# Patient Record
Sex: Female | Born: 1975 | Race: White | Hispanic: No | Marital: Married
Health system: Midwestern US, Academic
[De-identification: ages and names within clinical notes are randomized; demographics above are authoritative.]

---

## 2016-08-28 MED ORDER — SODIUM CHLORIDE 3 % IN NEBU
4 mL | Freq: Two times a day (BID) | RESPIRATORY_TRACT | 11 refills | 30.00000 days | Status: DC | PRN
Start: 2016-08-28 — End: 2017-09-18

## 2016-08-28 MED ORDER — FLUTICASONE-SALMETEROL 500-50 MCG/DOSE IN DSDV
1 | Freq: Two times a day (BID) | RESPIRATORY_TRACT | 11 refills | Status: AC
Start: 2016-08-28 — End: ?

## 2016-08-28 MED ORDER — SULFAMETHOXAZOLE-TRIMETHOPRIM 800-160 MG PO TAB
1 | ORAL_TABLET | ORAL | 3 refills | Status: DC
Start: 2016-08-28 — End: 2017-01-08

## 2016-10-17 ENCOUNTER — Encounter: Admit: 2016-10-17 | Discharge: 2016-10-17 | Payer: 59

## 2016-11-21 ENCOUNTER — Encounter: Admit: 2016-11-21 | Discharge: 2016-11-21 | Payer: 59

## 2016-11-21 NOTE — Telephone Encounter
Re-faxed request on 11/21/16 as medical records was asking who is requesting records.     Medical Records informed that we are pt's pulmonologist and needing all cultures for treatment plan they have on file.

## 2016-11-23 NOTE — Telephone Encounter
See Telephone Note dated 08/30/16 regarding sputum results from PCP.  These were received and sent to Dr. Maisie Fushomas for review.  Shearon StallsAmanda Ortiz-Cadena, RN

## 2016-11-23 NOTE — Telephone Encounter
Faxed records request to [facility] on 11/21/16 for the following: any and all sputim cultures and sensitivities.    Called Medical records at [facility] on 11/23/16 to follow-up on records request. Fax should be received by 11/27/16.

## 2016-12-01 ENCOUNTER — Encounter: Admit: 2016-12-01 | Discharge: 2016-12-01 | Payer: 59

## 2016-12-01 NOTE — Progress Notes
Review of outside records:  Sputum culture 07/06/2016: Heavy growth of Haemophilus influenza, beta-lactamase negative

## 2016-12-01 NOTE — Progress Notes
Review of outside records:  03/09/2016: Heavy growth of Haemophilus influenza, beta lactamase negative

## 2017-01-07 ENCOUNTER — Encounter: Admit: 2017-01-07 | Discharge: 2017-01-07 | Payer: 59

## 2017-01-08 MED ORDER — SULFAMETHOXAZOLE-TRIMETHOPRIM 800-160 MG PO TAB
ORAL_TABLET | 3 refills | Status: AC
Start: 2017-01-08 — End: 2017-09-18

## 2017-01-08 NOTE — Telephone Encounter
Pharmacy is requesting a refill of Bactrim DS. Unable to refill per Allergy/Immunology standing orders protocol as this medication is not part of the protocol.  Routing to Dr. Barbette OrGierer for refill if appropriate.

## 2017-02-06 ENCOUNTER — Ambulatory Visit: Admit: 2017-02-06 | Discharge: 2017-02-06 | Payer: 59

## 2017-02-06 ENCOUNTER — Encounter: Admit: 2017-02-06 | Discharge: 2017-02-06 | Payer: 59

## 2017-02-06 DIAGNOSIS — J45909 Unspecified asthma, uncomplicated: Principal | ICD-10-CM

## 2017-02-06 DIAGNOSIS — F909 Attention-deficit hyperactivity disorder, unspecified type: ICD-10-CM

## 2017-02-06 DIAGNOSIS — B999 Unspecified infectious disease: Principal | ICD-10-CM

## 2017-02-06 DIAGNOSIS — T781XXD Other adverse food reactions, not elsewhere classified, subsequent encounter: ICD-10-CM

## 2017-02-06 DIAGNOSIS — J309 Allergic rhinitis, unspecified: ICD-10-CM

## 2017-02-06 DIAGNOSIS — G43909 Migraine, unspecified, not intractable, without status migrainosus: ICD-10-CM

## 2017-02-06 DIAGNOSIS — Z92241 Personal history of systemic steroid therapy: ICD-10-CM

## 2017-02-06 DIAGNOSIS — R05 Cough: ICD-10-CM

## 2017-02-06 DIAGNOSIS — K449 Diaphragmatic hernia without obstruction or gangrene: ICD-10-CM

## 2017-02-06 DIAGNOSIS — J479 Bronchiectasis, uncomplicated: ICD-10-CM

## 2017-02-06 DIAGNOSIS — F329 Major depressive disorder, single episode, unspecified: ICD-10-CM

## 2017-02-06 DIAGNOSIS — J4550 Severe persistent asthma, uncomplicated: ICD-10-CM

## 2017-02-06 DIAGNOSIS — E282 Polycystic ovarian syndrome: ICD-10-CM

## 2017-02-06 DIAGNOSIS — J189 Pneumonia, unspecified organism: ICD-10-CM

## 2017-02-06 DIAGNOSIS — K219 Gastro-esophageal reflux disease without esophagitis: ICD-10-CM

## 2017-02-06 DIAGNOSIS — E039 Hypothyroidism, unspecified: ICD-10-CM

## 2017-02-06 MED ORDER — PREDNISONE 10 MG PO TAB
ORAL_TABLET | Freq: Every day | 0 refills | Status: AC
Start: 2017-02-06 — End: 2017-06-21

## 2017-02-06 NOTE — Progress Notes
Date of Service: 02/06/2017    Subjective:             Angela Berger is a 41 y.o. female with allergic rhinoconjunctivitis, food allergies, bronchiectasis, steroid therapy, chronic cough, recurrent infections, severe persistent asthma, and seasonal allergies who is here today for follow up.      History of Present Illness    She states she spent more time on steroids in the last month than not.  This was for both sinus and chest symptoms. She tried with prednisone 60 mg x 5 days and then 48 hours in she got fever and then started Augmentin.  She stopped her ppx Bactrim while on this. She was very short of breath with minimal exertions. She has finished her prednisone about a week ago. She had been using up to 4 puffs of albuterol every 2 hours and nebs. She does not think that the Bactrim has helped but her husband does not think she coughs as much. She thinks her flares might have been at longer intervals. She has not had bronchoscopy.  She would be amenable to going to [employer].      She went to a hotel last week off prednisone that pops popcorn daily at the [employer] meeting. She was using albuterol every 2 hours. She changed to Brovana and Pulmicort for several days and is now back to using them at night. She is taking Advair, Pulmicort, and Spiriva in the morning. She thinks the Advair makes her cough. Advair has been the best inhaler by far.  She has done better with the steroids.     She is doing her allergy injections only twice per month but none since being on steroids.    She walked into the room yesterday where there were corn chips and had itching and throat tightness. She has done SL drops for the corn allergy and she does not know anyone that can do this.      She has not gotten her flu shot.    Rhinitis Control Assessment Test    1. During the past week, how often did you have nasal congestion?  1) Extremely often  2) Often  3) Sometimes  4) Rarely  5) Never 2. During the past week, how often did you sneeze?  1) Extremely often  2) Often  3) Sometimes  4) Rarely  5) Never    3. During the past week, how often did you have watery eyes?  1) Extremely often  2) Often  3) Sometimes  4) Rarely  5) Never    4. During the past week, to what extent did your nasal or other allergy symptoms interfere with your sleep?  1) All the time  2) A lot  3) Somewhat  4) A little  5) Not at all    5. During the past week, how often did you avoid any activities (for example visiting a house with a dog or cat, gardening) because of your nasal or other allergy symptoms?  1) Extremely often  2) Often  3) Sometimes  4) Rarely  5) Never    6. During the past week, how well were your nasal or other allergy symptoms controlled?  1) Not at all  2) A little  3) Somewhat  4) Very  5) Completely    Total Score: 12  A total score of 21 or less may indicate inadequate control of symptoms.     Asthma Control Test for people  12 years and older    1. In the past 4 weeks, how much of the time did your asthma keep you from getting as much done at work, school or at home?    1-  All of the time  2-  Most of the time  3-  Some of the time  4-  A little of the time  5-  None of the time    2. During the past 4 weeks, how often have you had shortness of breath?    1-  More than once a day  2-  Once a day  3-  3-6 times a week  4-  Once or twice a week  5-  Not at all    3. During the past 4 weeks, how often did your asthma symptoms (wheezing, coughing, shortness of breath, chest tightness or pain) wake you up at night or earlier than usual in the morning?    1-  4 or more night a week  2-  2-3 nights a week  3-  Once a week  4-  Once or twice  5-  Not at all    4. During the past 4 weeks, how often have you used your rescue inhaler or nebulizer medication (such as albuterol)?    1-  3 or more times per day  2-  1 or 2 times per day  3-  2 or 3 times per week  4-  Once a week or less  5-  Not at all 5. How would you rate your asthma control during the past 4 weeks?    1- Not controlled at all  2- Poorly controlled  3- Somewhat controlled  4- Well controlled  5- Completely controlled      ACT SCORE = 6  (A score of 19 or less might suggest that the patient's asthma is not as well controlled as it could be)         Review of Systems   Constitutional: Positive for activity change, appetite change and fatigue.   HENT: Positive for congestion, facial swelling, postnasal drip, rhinorrhea, sinus pressure, sore throat and tinnitus.    Eyes: Positive for itching.   Respiratory: Positive for cough, chest tightness, shortness of breath, wheezing and stridor.    Gastrointestinal: Positive for diarrhea and nausea.   Allergic/Immunologic: Positive for environmental allergies and food allergies.   Neurological: Positive for headaches.   All other systems reviewed and are negative.        Objective:         ??? albuterol (VENTOLIN HFA, PROAIR HFA) 90 mcg/actuation inhaler Inhale 2 Puffs by mouth every 6 hours as needed for Wheezing.   ??? albuterol-ipratropium (DUO-NEB, DUO-VENT) 0.5 mg-3 mg(2.5 mg base)/3 mL nebulizer solution Inhale 3 mL solution by nebulizer as directed every 6 hours as needed for Wheezing.   ??? arformoterol (BROVANA) 15 mcg/2 mL nebulizer solution Inhale 2 mL solution by nebulizer as directed twice daily.   ??? budesonide respule(+) (PULMICORT) 1 mg/2 mL nbsp nebulizer solution Inhale 2 mL solution by nebulizer as directed twice daily.   ??? budesonide(+) (PULMICORT FLEXHALER) 180 mcg/inh inhaler Inhale 2 puffs by mouth into the lungs twice daily.   ??? Cholecalciferol (Vitamin D3) 2,000 unit cap Take 1 Cap by mouth daily.   ??? DIPHENHYDRAMINE HCL (BENADRYL ALLERGY PO) Take  by mouth as Needed.   ??? EPINEPHrine(+) (EPIPEN 2-PAK) 1 mg/mL injection pen (2-Pack) Inject 0.3 mg into the muscle once  as needed. Inject 0.3 mg (1 Pen) into thigh if needed for anaphylactic reaction. May repeat in 5-15 minutes if needed. ??? fluticasone/salmeterol (ADVAIR DISKUS) 500-50 mcg inhalation disk Inhale 1 puff by mouth into the lungs every 12 hours.   ??? ibuprofen (MOTRIN) 800 mg tablet Take 800 mg by mouth three times daily.   ??? Levocetirizine (XYZAL) 5 mg tab Take 10 mg by mouth twice daily.   ??? levothyroxine (SYNTHROID) 100 mcg tablet Take 100 mcg by mouth daily.   ??? Magnesium Oxide 500 mg cap Take 500 mg by mouth daily.   ??? metFORMIN-XR(+) (GLUCOPHAGE XR) 500 mg tablet Take 1,000 mg by mouth daily with dinner.   ??? methylphenidate CR (CONCERTA) 54 mg tablet Take 54 mg by mouth every morning   ??? mometasone (NASONEX) 50 mcg/actuation nasal spray Apply 2 Sprays to each nostril as directed daily.   ??? montelukast (SINGULAIR) 10 mg tablet Take 10 mg by mouth at bedtime daily.   ??? ondansetron (ZOFRAN) 8 mg tablet Take 8 mg by mouth every 8 hours as needed for Nausea.   ??? pantoprazole DR (PROTONIX) 40 mg tablet Take 40 mg by mouth daily.   ??? prednisone (DELTASONE) 10 mg tablet 60 mg daily for 5 days, taper by 10 mg every 3 days, then stay on 10 mg   ??? ranitidine(+) (ZANTAC) 150 mg tablet Take 150 mg by mouth twice daily.   ??? sertraline (ZOLOFT) 50 mg tablet Take 50 mg by mouth daily. Indications: only take 1/2 a tablet   ??? sodium chloride 3 % nebulizer solution Inhale 4 mL by mouth into the lungs twice daily as needed.   ??? tiotropium bromide (SPIRIVA RESPIMAT) 2.5 mcg/actuation inhaler Inhale 2 Puffs by mouth into the lungs daily.   ??? trimethoprim/sulfamethoxazole (BACTRIM DS) 160/800 mg tablet TAKE ONE (1) TABLET BY MOUTH 3 TIMES WEEKLY     Vitals:    02/06/17 1621   BP: (!) 162/92   Pulse: 102   Resp: 16   Temp: 36.4 ???C (97.6 ???F)   TempSrc: Oral   SpO2: 97%   Weight: 108.2 kg (238 lb 9.6 oz)   Height: 170.2 cm (67)     Body mass index is 37.37 kg/m???.     Physical Exam   Constitutional: She is oriented to person, place, and time. Vital signs are normal. She appears well-developed and well-nourished.   HENT: Head: Normocephalic and atraumatic.   Right Ear: Tympanic membrane, external ear and ear canal normal.   Left Ear: Tympanic membrane, external ear and ear canal normal.   Nose: Nose normal.   Mouth/Throat: Oropharynx is clear and moist and mucous membranes are normal. No oropharyngeal exudate.   Eyes: Conjunctivae are normal. Right eye exhibits no discharge. Left eye exhibits no discharge. No scleral icterus.   Cardiovascular: Normal rate, regular rhythm and normal heart sounds.  Exam reveals no gallop and no friction rub.    No murmur heard.  Pulmonary/Chest: Effort normal. No respiratory distress. She has wheezes (end expiratory bilateral bases). She has rhonchi. She has no rales. She exhibits no tenderness.   Neurological: She is alert and oriented to person, place, and time.   Skin: Skin is warm and dry. No rash noted. No erythema.   Psychiatric: She has a normal mood and affect.   Vitals reviewed.           Assessment and Plan:    Problem   Bronchiectasis (Hcc)    Minimal lingular, likely due to  recurrent infections  05/28/16 - Sweat chloride <10  Patient reported previous alpha 1 antitrypsin level normal and immune evaluation to date has been normal.    - Continue management as noted below     Recurrent Infections    Pneumonia possibly 5 times: culture + for Pseudomonas, Strep pneumo, and H influenza.  History of fundoplication and large hiatal hernia along with allergies.    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  Corn - anaphylaxis repeatedly, even with D5 IVFs and being around popcorn.  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    04/20/16 - IgE was negative to corn and wheat.    Patient is unable to be off antihistamines for food challenges.    - Continue avoidance due to reproducible clinical symptoms.     Productive Cough    09/2015 CT chest with RLL infiltrates, lingular bronchiectasis  02/2016 Sputum culture: H. Influenzae, AFB reportedly negative  05/2016 Sputum culture: H. influenzae    - As noted below     Uncomplicated Severe Persistent Asthma    PFT  Normal PFTs (08/2015)  Normal spirometry (04/2016)  Inhaler Regimen    ** Patient unable to tolerate HFA products due to corn allergy   **She feels this only lasts 22 hours  Advair 500/50 twice a day  Pulmicort 180 2 puffs BID  Spiriva 2.5 2 puffs daily  Albuterol (ProAir Respiclick) every 4 hours as needed  Albuterol/Ipratropium, Arformoterol, Pulmicort nebs to replace above inhalers when unable to get good deposition of inhalers  Comorbidity  GERD -- Protonix daily, well controlled with current diet, hiatal hernia present  Seasonal allergies -- Singulair, Flonase, Levocetirizine, immunotherapy with Dr. 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  Sleep studies in past negative for OSA  Vaccinations  Influenza - Fall 2017   Pneumococcal - 2011  Prevnar - 2014  Hib - 08/28/16 since sputum repeatedly positive for H influenzae  Exacerbations  Last 06/2016, multiple exacerbations since January  Sputum 02/2016 and 05/2016 with H. influenzae  Imaging  10/13/15 - CT chest revealed ground glass infiltrates and lingular bronchiectasis.  Complications Xolair caused palpitations repeatedly with dosing and a holter at that time showed PVCs.  Labs  04/20/16 - IgE 38, immune evaluation fairly normal, 0 eosinophils, essentially negative hypersensitivity panel with only slight elevated to penicillium, but no significant exposures  05/28/16 - sweat chloride was negative (<10)    - Continue current management.  - Will give another prednisone 60 mg taper  - Will do a trial of Fasenra.     Allergic Rhinoconjunctivitis    Currently on levocetirizine, Singulair, and Nasonex as well.  She is still getting weekly shots with Dr. Excell Seltzer and has been on them since 2008.  She thinks they have helped.  Shot 1: DF, DP, cat, Alternaria, Helminothosporium, Epicoccum, ragweed, [place], aspergillus, maple, Cladosporium, dog, June, Fusarium, and Mucor  Shot 2: planbtain, marshelder, sycamore, lambsquarter, goldenrod, pigweed, mountain cedar, elm, walnut, [place], [place], [place], R thistle, [place], and [place].  She has large local reactions but no systemic reactions.    04/20/16 - IgE aeroallergen panel was positive only to ash, cat, short/Western ragweed.    - Continue current management.      H/O Corticosteroid Therapy    Recurrent corticosteroid exposure, family history of osteoporosis.  Per patient, DEXA summer 2017 was within normal limits.    - Continue monitoring and vitamin D therapy.        RTC in 4 weeks.    Thank you for allowing [place] to participate in the care of this patient.  Please feel free to contact us if there are any questions or concerns about the patient.  ???  Hyman Bower, DO  Associate Professor  Division of Allergy, Immunology, and Rheumatology  Department of Medicine and Department of Pediatrics  [facility]

## 2017-02-07 LAB — COMPREHENSIVE METABOLIC PANEL

## 2017-02-07 LAB — CBC AND DIFF

## 2017-02-07 LAB — SED RATE

## 2017-02-07 LAB — C REACTIVE PROTEIN (CRP)

## 2017-02-12 ENCOUNTER — Encounter: Admit: 2017-02-12 | Discharge: 2017-02-12 | Payer: 59

## 2017-02-12 NOTE — Telephone Encounter
Access 360 Enrollment Form for Fasenra faxed to [employer] at [phone].

## 2017-02-17 ENCOUNTER — Encounter: Admit: 2017-02-17 | Discharge: 2017-02-17 | Payer: 59

## 2017-02-17 DIAGNOSIS — F909 Attention-deficit hyperactivity disorder, unspecified type: ICD-10-CM

## 2017-02-17 DIAGNOSIS — G43909 Migraine, unspecified, not intractable, without status migrainosus: ICD-10-CM

## 2017-02-17 DIAGNOSIS — J189 Pneumonia, unspecified organism: ICD-10-CM

## 2017-02-17 DIAGNOSIS — J45909 Unspecified asthma, uncomplicated: Principal | ICD-10-CM

## 2017-02-17 DIAGNOSIS — F329 Major depressive disorder, single episode, unspecified: ICD-10-CM

## 2017-02-17 DIAGNOSIS — E282 Polycystic ovarian syndrome: ICD-10-CM

## 2017-02-17 DIAGNOSIS — E039 Hypothyroidism, unspecified: ICD-10-CM

## 2017-02-17 DIAGNOSIS — K449 Diaphragmatic hernia without obstruction or gangrene: ICD-10-CM

## 2017-02-17 DIAGNOSIS — J309 Allergic rhinitis, unspecified: Secondary | ICD-10-CM

## 2017-02-17 DIAGNOSIS — K219 Gastro-esophageal reflux disease without esophagitis: ICD-10-CM

## 2017-02-20 NOTE — Telephone Encounter
Spoke with Mewded with [employer].  She reports they faxed the benefit investigation information; she reprots the pt has [employer] and they were unable to obtain benefit details as they are 3rd party, they will only release benefit information to pt or provider.  Asked her to re-fax the information.

## 2017-02-27 ENCOUNTER — Encounter: Admit: 2017-02-27 | Discharge: 2017-02-27 | Payer: 59

## 2017-02-27 MED ORDER — BENRALIZUMAB 30 MG/ML SC SYRG
SUBCUTANEOUS | 6 refills | 56.00000 days | Status: AC
Start: 2017-02-27 — End: 2017-09-18

## 2017-03-01 ENCOUNTER — Encounter: Admit: 2017-03-01 | Discharge: 2017-03-01 | Payer: 59

## 2017-03-01 DIAGNOSIS — B999 Unspecified infectious disease: Principal | ICD-10-CM

## 2017-03-04 ENCOUNTER — Encounter: Admit: 2017-03-04 | Discharge: 2017-03-04 | Payer: 59

## 2017-03-08 ENCOUNTER — Encounter: Admit: 2017-03-08 | Discharge: 2017-03-08 | Payer: 59

## 2017-03-08 NOTE — Progress Notes
The Prior Authorization for [name] was submitted for Angela GainSusan L M Berger via [email or web address].  Will continue to follow.    Foye SpurlingAllison Hernando Reali CPhT   Pharmacy Patient Advocate  Ext [phone]

## 2017-03-08 NOTE — Telephone Encounter
Received and reviewed labwork from [facility].  Some records dated back to 2008 and I could not locate an eosinophil level >158, which was her absolute eosinophil level on 02-07-17.  Per Dr. Barbette OrGierer she has been on steroids so the eosinophil count has been low.  Sent email to [place] asking her submit PA.  Records placed in scan box.

## 2017-03-11 ENCOUNTER — Encounter: Admit: 2017-03-11 | Discharge: 2017-03-11 | Payer: 59

## 2017-03-11 ENCOUNTER — Ambulatory Visit: Admit: 2017-03-11 | Discharge: 2017-03-11 | Payer: 59

## 2017-03-11 DIAGNOSIS — J45909 Unspecified asthma, uncomplicated: Principal | ICD-10-CM

## 2017-03-11 DIAGNOSIS — E282 Polycystic ovarian syndrome: ICD-10-CM

## 2017-03-11 DIAGNOSIS — F329 Major depressive disorder, single episode, unspecified: ICD-10-CM

## 2017-03-11 DIAGNOSIS — J479 Bronchiectasis, uncomplicated: Principal | ICD-10-CM

## 2017-03-11 DIAGNOSIS — J309 Allergic rhinitis, unspecified: Secondary | ICD-10-CM

## 2017-03-11 DIAGNOSIS — F909 Attention-deficit hyperactivity disorder, unspecified type: ICD-10-CM

## 2017-03-11 DIAGNOSIS — G43909 Migraine, unspecified, not intractable, without status migrainosus: ICD-10-CM

## 2017-03-11 DIAGNOSIS — K219 Gastro-esophageal reflux disease without esophagitis: ICD-10-CM

## 2017-03-11 DIAGNOSIS — E039 Hypothyroidism, unspecified: ICD-10-CM

## 2017-03-11 DIAGNOSIS — J4551 Severe persistent asthma with (acute) exacerbation: ICD-10-CM

## 2017-03-11 DIAGNOSIS — J4550 Severe persistent asthma, uncomplicated: Principal | ICD-10-CM

## 2017-03-11 DIAGNOSIS — J189 Pneumonia, unspecified organism: ICD-10-CM

## 2017-03-11 DIAGNOSIS — K449 Diaphragmatic hernia without obstruction or gangrene: ICD-10-CM

## 2017-03-11 DIAGNOSIS — R05 Cough: ICD-10-CM

## 2017-03-11 MED ORDER — IPRATROPIUM-ALBUTEROL 0.5 MG-3 MG(2.5 MG BASE)/3 ML IN NEBU
3 mL | RESPIRATORY_TRACT | 0 refills | Status: AC | PRN
Start: 2017-03-11 — End: ?

## 2017-03-11 NOTE — Telephone Encounter
Contacted pt to discuss.  Pt was agreeable to imaging and appointment with Dr. Ranae Plumberrosser.  Pt requested we fax [place] [facility] [place] [facility] [place]  Done.  Will send a Staff Message to [facility] to contact pt to schedule with Dr. Ranae Plumberrosser after confirming availability.  Shearon StallsAmanda Ortiz-Cadena, RN

## 2017-03-11 NOTE — Telephone Encounter
-----  Message from Elpidio Eric, MD sent at 03/06/2017  1:14 PM CST -----  I can see her late next week (Thursday or Friday)  or the week of 11/26.  Would like a CT chest w/o contrast at that time.       ----- Message -----  From: Debby Freiberg, DO  Sent: 03/01/2017  10:04 PM  To: Guy Begin Case, RN, Elpidio Eric, MD    Sharee Pimple, Please let her know that her eosinophil count was 158, which is ever so slightly elevated.  Her ESR and CRP are also elevated.  I would like to have her consider the Fasenra at this point and also a bronchoscopy.  Unfortunately her pulmonologist is out on maternity leave.  I don't want her to wait until 04/2017 when she returns.      Crosser, do you think you could help me with this patient while Mickel Baas is out?  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  Impressive CRP.    Debby Freiberg, DO

## 2017-03-11 NOTE — Progress Notes
Subjective:       History of Present Illness  Angela Berger is a 41 y.o. female with allergic rhinoconjunctivitis, food allergies, bronchiectasis, steroid therapy, chronic cough, recurrent infections, severe persistent asthma, and seasonal allergies who is here today for follow up.      At her last visit on 02/06/17, we gave her another prednisone 60 mg taper and asked her to consider a trial of Fasenra. However, we have run into problems getting this started.  We asked her to continue her Advair, Pulmicort, Spiriva, and albuterol as needed.  She has DuoNeb, arformoterol, and Pulmicort nebs to replace inhalers when able to get good deposition of her inhaler (MDI).  She states the prednisone is helpful.  She staes that her current insurance is not very good at getting things done at [facility].  She would rather see Fredric Mare at this point, due to having a contract with [facility] because of this. She thinks have gotten better as it has started to freeze. She is down to 10 mg of prednisone at this point. She states 2 weeks ago, she was not okay on 30 mg. She is not sure she is going to be able to come down. She is intermittenly products with thick discolored mucous (green/yellow) with at least one coughing episode per day and is still taking her albuterol at least once per day.  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 She only spent 90 minutes there yesterday and she did better.  She states that their cleaning supplies trigger her upon exposure. She states it has been a couple years since she has seen Dr. Fredric Mare.    Her energy level is lower than at the 30 mg or above on the prednisone.      She was treated for strep throat last week.    Rhinitis Control Assessment Test    1. During the past week, how often did you have nasal congestion?  1) Extremely often  2) Often  3) Sometimes  4) Rarely  5) Never    2. During the past week, how often did you sneeze? 1) Extremely often  2) Often  3) Sometimes  4) Rarely  5) Never    3. During the past week, how often did you have watery eyes?  1) Extremely often  2) Often  3) Sometimes  4) Rarely  5) Never    4. During the past week, to what extent did your nasal or other allergy symptoms interfere with your sleep?  1) All the time  2) A lot  3) Somewhat  4) A little  5) Not at all    5. During the past week, how often did you avoid any activities (for example visiting a house with a dog or cat, gardening) because of your nasal or other allergy symptoms?  1) Extremely often  2) Often  3) Sometimes  4) Rarely  5) Never    6. During the past week, how well were your nasal or other allergy symptoms controlled?  1) Not at all  2) A little  3) Somewhat  4) Very  5) Completely    Total Score: 21  A total score of 21 or less may indicate inadequate control of symptoms.     Asthma Control Test for people 12 years and older    1. In the past 4 weeks, how much of the time did your asthma  keep you from getting as much done at work, school or at home?    1-  All of the time  2-  Most of the time  3-  Some of the time  4-  A little of the time  5-  None of the time    2. During the past 4 weeks, how often have you had shortness of breath?    1-  More than once a day  2-  Once a day  3-  3-6 times a week  4-  Once or twice a week  5-  Not at all    3. During the past 4 weeks, how often did your asthma symptoms (wheezing, coughing, shortness of breath, chest tightness or pain) wake you up at night or earlier than usual in the morning?    1-  4 or more night a week  2-  2-3 nights a week  3-  Once a week  4-  Once or twice  5-  Not at all    4. During the past 4 weeks, how often have you used your rescue inhaler or nebulizer medication (such as albuterol)?    1-  3 or more times per day  2-  1 or 2 times per day  3-  2 or 3 times per week  4-  Once a week or less  5-  Not at all 5. How would you rate your asthma control during the past 4 weeks?    1- Not controlled at all  2- Poorly controlled  3- Somewhat controlled  4- Well controlled  5- Completely controlled    ACT SCORE = 12  (A score of 19 or less might suggest that the patient's asthma is not as well controlled as it could be)           Review of Systems   Constitutional: Positive for fatigue.   Respiratory: Positive for cough, chest tightness and wheezing.    Allergic/Immunologic: Positive for environmental allergies and food allergies.   A 10 point review of systems has been reviewed and the remainder are all negative other than as noted above and as in the HPI.      Objective:         ??? albuterol (VENTOLIN HFA, PROAIR HFA) 90 mcg/actuation inhaler Inhale 2 Puffs by mouth every 6 hours as needed for Wheezing.   ??? albuterol-ipratropium (DUO-NEB, DUO-VENT) 0.5 mg-3 mg(2.5 mg base)/3 mL nebulizer solution Inhale 3 mL solution by nebulizer as directed every 6 hours as needed for Wheezing.   ??? arformoterol (BROVANA) 15 mcg/2 mL nebulizer solution Inhale 2 mL solution by nebulizer as directed twice daily.   ??? benralizumab (FASENRA) 30 mg/mL injection syringe Inject 30 mg under the skin every 4 weeks for the first 3 doses, followed by every 8 weeks thereafter.   ??? budesonide respule(+) (PULMICORT) 1 mg/2 mL nbsp nebulizer solution Inhale 2 mL solution by nebulizer as directed twice daily.   ??? budesonide(+) (PULMICORT FLEXHALER) 180 mcg/inh inhaler Inhale 2 puffs by mouth into the lungs twice daily.   ??? Cholecalciferol (Vitamin D3) 2,000 unit cap Take 1 Cap by mouth daily.   ??? DIPHENHYDRAMINE HCL (BENADRYL ALLERGY PO) Take  by mouth as Needed.   ??? EPINEPHrine(+) (EPIPEN 2-PAK) 1 mg/mL injection pen (2-Pack) Inject 0.3 mg into the muscle once as needed. Inject 0.3 mg (1 Pen) into thigh if needed for anaphylactic reaction. May repeat in 5-15 minutes if needed.   ???  fluticasone/salmeterol (ADVAIR DISKUS) 500-50 mcg inhalation disk Inhale 1 puff by mouth into the lungs every 12 hours.   ??? ibuprofen (MOTRIN) 800 mg tablet Take 800 mg by mouth three times daily.   ??? Levocetirizine (XYZAL) 5 mg tab Take 10 mg by mouth twice daily.   ??? levothyroxine (SYNTHROID) 100 mcg tablet Take 100 mcg by mouth daily.   ??? Magnesium Oxide 500 mg cap Take 500 mg by mouth daily.   ??? metFORMIN-XR(+) (GLUCOPHAGE XR) 500 mg tablet Take 1,000 mg by mouth daily with dinner.   ??? methylphenidate CR (CONCERTA) 54 mg tablet Take 54 mg by mouth every morning   ??? mometasone (NASONEX) 50 mcg/actuation nasal spray Apply 2 Sprays to each nostril as directed daily.   ??? montelukast (SINGULAIR) 10 mg tablet Take 10 mg by mouth at bedtime daily.   ??? ondansetron (ZOFRAN) 8 mg tablet Take 8 mg by mouth every 8 hours as needed for Nausea.   ??? pantoprazole DR (PROTONIX) 40 mg tablet Take 40 mg by mouth daily.   ??? prednisone (DELTASONE) 10 mg tablet 60 mg daily for 5 days, taper by 10 mg every 3 days, then stay on 10 mg   ??? ranitidine(+) (ZANTAC) 150 mg tablet Take 150 mg by mouth twice daily.   ??? sertraline (ZOLOFT) 50 mg tablet Take 50 mg by mouth daily. Indications: only take 1/2 a tablet   ??? sodium chloride 3 % nebulizer solution Inhale 4 mL by mouth into the lungs twice daily as needed.   ??? tiotropium bromide (SPIRIVA RESPIMAT) 2.5 mcg/actuation inhaler Inhale 2 Puffs by mouth into the lungs daily.   ??? trimethoprim/sulfamethoxazole (BACTRIM DS) 160/800 mg tablet TAKE ONE (1) TABLET BY MOUTH 3 TIMES WEEKLY     Vitals:    03/11/17 1022 03/11/17 1023   BP: (!) 155/100 (!) 160/99   Pulse: 99 94   Resp: 19    Temp: 37.1 ???C (98.7 ???F)    TempSrc: Oral    SpO2: 95%    Weight: 108.9 kg (240 lb)    Height: 170.2 cm (67)      Body mass index is 37.59 kg/m???.     Physical Exam   Constitutional: She is oriented to person, place, and time. Vital signs are normal. She appears well-developed and well-nourished.   HENT:   Head: Normocephalic and atraumatic. Right Ear: Tympanic membrane, external ear and ear canal normal.   Left Ear: Tympanic membrane, external ear and ear canal normal.   Nose: Nose normal.   Mouth/Throat: Oropharynx is clear and moist and mucous membranes are normal. No oropharyngeal exudate.   Eyes: Conjunctivae are normal. Right eye exhibits no discharge. Left eye exhibits no discharge. No scleral icterus.   Cardiovascular: Normal rate, regular rhythm and normal heart sounds.  Exam reveals no gallop and no friction rub.    No murmur heard.  Pulmonary/Chest: Effort normal. No respiratory distress. She has wheezes. She has no rales. She exhibits no tenderness.   Neurological: She is alert and oriented to person, place, and time.   Skin: Skin is warm and dry. No rash noted. No erythema.   Psychiatric: She has a normal mood and affect.   Vitals reviewed.               Ref. Range 05/28/2016 11:06 03/11/2017 10:41   FVC-Pre Latest Units: L 3.79 2.67   FVC-%Pred-pre Latest Units: % 95 65   FEV1-Pre Latest Units: L 3.22 2.22   FEV1-%Pred-Pre Latest Units: %  99 67   FEV1/FVC-Pre Latest Units: % 85 83   FEV1FVC-LLN Latest Units: % 99 72   FEF2575-%Pred-Pre Latest Units: % 108 65   FEF2575-Pre Latest Units: L/sec 3.53 2.14   PEF-Pre Latest Units: L/min 419.2      Assessment and Plan:    Problem   Severe Persistent Asthma With Acute Exacerbation    PFT  Normal PFTs (08/2015)  Normal spirometry (04/2016)  Inhaler Regimen    ** Patient unable to tolerate HFA products due to corn allergy   **She feels this only lasts 22 hours  Advair 500/50 twice a day  Pulmicort 180 2 puffs BID  Spiriva 2.5 2 puffs daily  Albuterol (ProAir Respiclick) every 4 hours as needed  Albuterol/Ipratropium, Arformoterol, Pulmicort nebs to replace above inhalers when unable to get good deposition of inhalers  Comorbidity  GERD -- Protonix daily, well controlled with current diet, hiatal hernia present  Seasonal allergies -- Singulair, Flonase, Levocetirizine, immunotherapy with Dr. 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  Sleep studies in past negative for OSA  Vaccinations  Influenza - Fall 2017   Pneumococcal - 2011  Prevnar - 2014  Hib - 08/28/16 since sputum repeatedly positive for H influenzae  Exacerbations  Last 06/2016, multiple exacerbations since January  Sputum 02/2016 and 05/2016 with H. influenzae  Imaging  10/13/15 - CT chest revealed ground glass infiltrates and lingular bronchiectasis.  Complications  Xolair caused palpitations repeatedly with dosing and a holter at that time showed PVCs.  Labs  04/20/16 - IgE 38, immune evaluation fairly normal, 0 eosinophils, essentially negative hypersensitivity panel with only slight elevated to penicillium, but no significant exposures  05/28/16 - sweat chloride was negative (<10).    Still having significant symptoms although better on the steroids.  However, her spirometry today is moderately restrictive and her FEV1 is down by 1 L.    - I am very concerned she may have more of a lingering undiagnosed infection or even underlying other inflammatory pathology  - I do think she would benefit at this point from bronchoscopy, which she preferred to do with Dr. Fredric Mare due to proximity and insurance.   - I was able to speak with Dr. Fredric Mare and he requested repeat imaging prior to bronchoscopy and he found a small PE.  While I do not have those results, he is starting her on treatment for this and then will eventually proceed with bronchoscopy.  - In the interim, we will continue her prednisone 10 mg until she sees Dr. Fredric Mare  - Will attempt to start [facility] for her as well, which she prefers to do with Dr. Louie Casa office.        RTC in 6 weeks.    Thank you for allowing [place] to participate in the care of this patient. Please feel free to contact us if there are any questions or concerns about the patient.  ???  Hyman Bower, DO  Associate Professor  Division of Allergy, Immunology, and Rheumatology  Department of Medicine and Department of Pediatrics  [facility]

## 2017-03-12 ENCOUNTER — Encounter: Admit: 2017-03-12 | Discharge: 2017-03-12 | Payer: 59

## 2017-03-12 NOTE — Progress Notes
The Prior Authorization for Harrington ChallengerFasenra was approved for Angela Berger from 03/11/2017 to 03/11/2018 The patient is mandated to fill at [facility] .       Foye SpurlingAllison Dakoda Laventure CPhT   Pharmacy Patient Advocate  Ext [phone]

## 2017-03-13 NOTE — Telephone Encounter
Fax # is incorrect on the [facility] approval letter.  [facility] notified.  Faxed Rx and approval letter to [facility]:  [facility] [place] [id number] [phone] (Phone)  [phone] (Fax)

## 2017-03-13 NOTE — Telephone Encounter
Per 03-12-17 email from Peyton BottomsAllison S., [facility], "I got the approval for Angela ProfferSusan Pences Berger . She is however mandated to [facility]  if you could send the prescription over there."      Per fax dated 03-11-17 from [facility]:  Angela ChallengerFasenra has been approved for benefit coverage, future claims for this drug must be filled through [facility]  Ph: [phone]  Fax: [phone]    Faxed Rx for Angela ChallengerFasenra and approval letter to [facility].

## 2017-03-14 ENCOUNTER — Encounter: Admit: 2017-03-14 | Discharge: 2017-03-15 | Payer: 59

## 2017-03-14 DIAGNOSIS — R69 Illness, unspecified: Principal | ICD-10-CM

## 2017-03-20 NOTE — Telephone Encounter
Spoke with Dr. Barbette OrGierer and she gives authorization for Dr. Louie CasaBailey's office to coordinate delivery and administration [facility].      Called Dr. Fredric MareBailey [phone].  Left VM requesting callback.      Called [facility] [phone].  Spoke with Judeth CornfieldStephanie, pharmacist, and she reports she will disregard the pre-scheduling arrangements that we had for 11/27.  She reports the other doctor's office (Dr. Fredric MareBailey) can arrange the delivery to their office and she reports we can provide them the doctor's phone # and they can call.  Let her know I will f/u with Dr. Louie CasaBailey's office first.

## 2017-03-20 NOTE — Telephone Encounter
Contacted the pt to follow up.  She states her insurance is mandating [facility] be seen [facility] [place]  She had the CT chest and was found to have a PE.  She is currently on treatment for this.  She [facility].  He has decided to perform a bronchoscopy.  Pt is scheduled for repeat CT chest on 04/14/17.  She would like us to be aware of her care as she will be changing jobs soon and will be transitioning back to us at that time.  Routing to Richardson DoppNicole Rodriguez, [place], CCC-SLP to assist with records collection.  Shearon StallsAmanda Ortiz-Cadena, RN

## 2017-03-20 NOTE — Telephone Encounter
Medical Records request sent to [facility] to obtain all office notes from Dr. Eduard RouxJason Bailey and all CT Chest images clouded and reports faxed.     Request sent to Dr. Arletha PiliJason Bailey's office to have all office notes, and all CT Chest images clouded and reports faxed. Included message to office that request was also sent to [facility] medical records due to unknown factor of records being accessible through system.   Richardson DoppNicole Meaghan Whistler, MA    Dr. Eduard RouxJason Bailey  T# [phone]  F# [phone]    Routing to Shearon StallsAmanda Ortiz-Cadena, RN

## 2017-03-26 NOTE — Telephone Encounter
Spoke with Angela Berger, Angela Berger's office.  He reports they don't give the Fasenra in their office, but they have some patients that do this through the pharmacy in the hospital and he gave this information to the pt.  He reports the same day surgery nurses at [facility] give this and coordinate the appointments for the patients and the [facility] pharmacy purchaser, Casey BurkittCindy Reed, contacts the specialty pharmacy to schedule shipments.  He reports she needs an order and the authorization information.  He provides her contact information:  Ph: [phone]  Email: [email or web address]    He also reports the pt had a pulmonary embolism on the 15th and Dr. Fredric MareBailey has talked with Dr. Barbette OrGierer about this.  He reports he will email Arline Aspindy.

## 2017-03-26 NOTE — Telephone Encounter
Received VM from [facility], Dr. Louie CasaBailey's office.  Returned the call at [phone].  Left VM requesting a return call.

## 2017-03-27 NOTE — Telephone Encounter
Incoming fax from [facility] records with all Dr. Fredric MareBailey office notes. On cover letter, MR requested myself to call Radiology for images. Called [facility] (T# [phone], F# [phone]) to request CT Chest images be clouded and reports faxed. Images are being clouded and reports faxed to clinic.     Incoming fax documents forwarded to Shearon StallsAmanda Ortiz-Cadena, RN.   Angela DoppNicole Kelliann Pendergraph, MA    Routing to Shearon StallsAmanda Ortiz-Cadena, Charity fundraiserN.

## 2017-03-27 NOTE — Telephone Encounter
No douments have been received. Resending fax request to both hospital and dr office for records.   Richardson DoppNicole Lewayne Pauley, MA    Routing to Shearon StallsAmanda Ortiz-Cadena, Charity fundraiserN.

## 2017-03-27 NOTE — Telephone Encounter
Incoming fax from [facility] department with ct angio chest w/contrast. Documents scanned and forwarded to Shearon StallsAmanda Ortiz-Cadena, RN.   Richardson DoppNicole Teon Hudnall, MA    Routing to Shearon StallsAmanda Ortiz-Cadena, Charity fundraiserN.

## 2017-03-27 NOTE — Telephone Encounter
Spoke with Casey Burkittindy Reed, pharmacy purchaser at [facility] (ph. [phone]).  Per Arline Aspindy, she will coordinate with the specialty pharmacy for delivery [facility] the [facility] and their same day surgery nurses will call pt to schedule appointments.  She requests that we send:  Rx  Approval letter  Insurance information  Pt information including phone and address  Fax to [phone] Attn. Arline AspCindy    Called pt [phone].  Left VM requesting a return call.      Will f/u with pt and send this information to Hudson Bendindy.

## 2017-03-29 NOTE — Telephone Encounter
Received call from [place]indy and per [place] she has received the information I sent.  She will let me know if further information is needed.

## 2017-03-29 NOTE — Telephone Encounter
Called pt [phone].  Provided an update and the information below.  Pt reports she is ok moving forward with this.  Advised to let me know if she does not hear from the same-day surgery nurses to schedule.  Pt verbalized understanding and her questions were answered.      Information requested by Werner Leanindy R. faxed to her at [phone].

## 2017-04-01 ENCOUNTER — Encounter: Admit: 2017-04-01 | Discharge: 2017-04-01 | Payer: 59

## 2017-04-01 DIAGNOSIS — R69 Illness, unspecified: Principal | ICD-10-CM

## 2017-04-02 NOTE — Telephone Encounter
Records received.  Forwarded to Angela PearsonEmily Smiley, APRN-NP for review.  Will have them scanned into chart.  Images are in the chart.  See Documentation Encounter dated 04/01/17.  Shearon StallsAmanda Ortiz-Cadena, RN

## 2017-04-11 NOTE — Telephone Encounter
Per 04-11-17 email from Gladstoneindy R., [facility], "Yes, we have received the medication and she is scheduled to come in tomorrow (04-12-17) for [facility] to administer her medication.    Thank you for all your help."

## 2017-04-11 NOTE — Telephone Encounter
Per 04-08-17 email from Tiffinindy R., [facility]:    "Hello Noreene LarssonJill,  I have called to schedule a shipment for delivery to the hospital.   On the prescription it is not clearified that the patient will be receiving this at a different facility than your office.    (part 4 of the Access 360 Enrollment Form).  The special handling department is going to have a pharmacist contact you to clarify the shipping address.    If you would please call [phone] and add our shipping address to the presciption.    "[facility]"    Attn: Pharmacy    [address]    [place], [place]  [phone]  Phone number is listed below    Thank you for all your help.    Respectfully,  Cindy L. Renato Gailseed, CPhT  Surveyor, mining340B Coordinator / Administrator, artsBuyer  Certified Pharmacy Technician  [facility], [place]  [email or web address]  Phone [phone]   Fax  [phone]"    ----------  [facility] at [phone].  Spoke with Leotis ShamesLauren, pharmacist, and she reports this was delivered to their address on 04-10-17.  Email sent to [facility] letting her know.

## 2017-05-24 ENCOUNTER — Encounter: Admit: 2017-05-24 | Discharge: 2017-05-24 | Payer: 59

## 2017-06-21 ENCOUNTER — Encounter: Admit: 2017-06-21 | Discharge: 2017-06-21 | Payer: 59

## 2017-06-21 ENCOUNTER — Ambulatory Visit: Admit: 2017-06-21 | Discharge: 2017-06-21 | Payer: 59

## 2017-06-21 DIAGNOSIS — K449 Diaphragmatic hernia without obstruction or gangrene: ICD-10-CM

## 2017-06-21 DIAGNOSIS — I2699 Other pulmonary embolism without acute cor pulmonale: ICD-10-CM

## 2017-06-21 DIAGNOSIS — J45909 Unspecified asthma, uncomplicated: Principal | ICD-10-CM

## 2017-06-21 DIAGNOSIS — Z92241 Personal history of systemic steroid therapy: ICD-10-CM

## 2017-06-21 DIAGNOSIS — J479 Bronchiectasis, uncomplicated: ICD-10-CM

## 2017-06-21 DIAGNOSIS — Z8709 Personal history of other diseases of the respiratory system: Principal | ICD-10-CM

## 2017-06-21 DIAGNOSIS — G43909 Migraine, unspecified, not intractable, without status migrainosus: ICD-10-CM

## 2017-06-21 DIAGNOSIS — T781XXD Other adverse food reactions, not elsewhere classified, subsequent encounter: ICD-10-CM

## 2017-06-21 DIAGNOSIS — J189 Pneumonia, unspecified organism: ICD-10-CM

## 2017-06-21 DIAGNOSIS — R05 Cough: ICD-10-CM

## 2017-06-21 DIAGNOSIS — K219 Gastro-esophageal reflux disease without esophagitis: ICD-10-CM

## 2017-06-21 DIAGNOSIS — J4551 Severe persistent asthma with (acute) exacerbation: ICD-10-CM

## 2017-06-21 DIAGNOSIS — E039 Hypothyroidism, unspecified: ICD-10-CM

## 2017-06-21 DIAGNOSIS — J309 Allergic rhinitis, unspecified: Secondary | ICD-10-CM

## 2017-06-21 DIAGNOSIS — B999 Unspecified infectious disease: ICD-10-CM

## 2017-06-21 DIAGNOSIS — F909 Attention-deficit hyperactivity disorder, unspecified type: ICD-10-CM

## 2017-06-21 DIAGNOSIS — E282 Polycystic ovarian syndrome: ICD-10-CM

## 2017-06-21 DIAGNOSIS — F329 Major depressive disorder, single episode, unspecified: ICD-10-CM

## 2017-06-21 MED ORDER — PREDNISONE 5 MG PO TAB
7.5 mg | ORAL_TABLET | Freq: Every day | ORAL | 1 refills | Status: AC
Start: 2017-06-21 — End: 2017-08-16

## 2017-07-15 ENCOUNTER — Encounter: Admit: 2017-07-15 | Discharge: 2017-07-15 | Payer: 59

## 2017-07-26 ENCOUNTER — Encounter: Admit: 2017-07-26 | Discharge: 2017-07-26 | Payer: 59

## 2017-08-14 ENCOUNTER — Encounter: Admit: 2017-08-14 | Discharge: 2017-08-14 | Payer: 59

## 2017-08-16 MED ORDER — PREDNISONE 5 MG PO TAB
ORAL_TABLET | Freq: Every day | 1 refills | Status: AC
Start: 2017-08-16 — End: 2017-09-18

## 2017-09-18 ENCOUNTER — Encounter: Admit: 2017-09-18 | Discharge: 2017-09-18 | Payer: 59

## 2017-09-18 ENCOUNTER — Ambulatory Visit: Admit: 2017-09-18 | Discharge: 2017-09-18 | Payer: 59

## 2017-09-18 DIAGNOSIS — J479 Bronchiectasis, uncomplicated: ICD-10-CM

## 2017-09-18 DIAGNOSIS — J4551 Severe persistent asthma with (acute) exacerbation: ICD-10-CM

## 2017-09-18 DIAGNOSIS — R05 Cough: ICD-10-CM

## 2017-09-18 DIAGNOSIS — J189 Pneumonia, unspecified organism: ICD-10-CM

## 2017-09-18 DIAGNOSIS — K219 Gastro-esophageal reflux disease without esophagitis: ICD-10-CM

## 2017-09-18 DIAGNOSIS — G43909 Migraine, unspecified, not intractable, without status migrainosus: ICD-10-CM

## 2017-09-18 DIAGNOSIS — F909 Attention-deficit hyperactivity disorder, unspecified type: ICD-10-CM

## 2017-09-18 DIAGNOSIS — J309 Allergic rhinitis, unspecified: Principal | ICD-10-CM

## 2017-09-18 DIAGNOSIS — J45909 Unspecified asthma, uncomplicated: Principal | ICD-10-CM

## 2017-09-18 DIAGNOSIS — B999 Unspecified infectious disease: ICD-10-CM

## 2017-09-18 DIAGNOSIS — E282 Polycystic ovarian syndrome: ICD-10-CM

## 2017-09-18 DIAGNOSIS — K449 Diaphragmatic hernia without obstruction or gangrene: ICD-10-CM

## 2017-09-18 DIAGNOSIS — F329 Major depressive disorder, single episode, unspecified: ICD-10-CM

## 2017-09-18 DIAGNOSIS — T781XXS Other adverse food reactions, not elsewhere classified, sequela: ICD-10-CM

## 2017-09-18 DIAGNOSIS — E039 Hypothyroidism, unspecified: ICD-10-CM

## 2017-09-23 ENCOUNTER — Encounter: Admit: 2017-09-23 | Discharge: 2017-09-23 | Payer: 59

## 2017-09-23 DIAGNOSIS — F909 Attention-deficit hyperactivity disorder, unspecified type: ICD-10-CM

## 2017-09-23 DIAGNOSIS — J189 Pneumonia, unspecified organism: ICD-10-CM

## 2017-09-23 DIAGNOSIS — E039 Hypothyroidism, unspecified: ICD-10-CM

## 2017-09-23 DIAGNOSIS — J45909 Unspecified asthma, uncomplicated: Principal | ICD-10-CM

## 2017-09-23 DIAGNOSIS — J309 Allergic rhinitis, unspecified: Secondary | ICD-10-CM

## 2017-09-23 DIAGNOSIS — E282 Polycystic ovarian syndrome: ICD-10-CM

## 2017-09-23 DIAGNOSIS — F329 Major depressive disorder, single episode, unspecified: ICD-10-CM

## 2017-09-23 DIAGNOSIS — K449 Diaphragmatic hernia without obstruction or gangrene: ICD-10-CM

## 2017-09-23 DIAGNOSIS — K219 Gastro-esophageal reflux disease without esophagitis: ICD-10-CM

## 2017-09-23 DIAGNOSIS — G43909 Migraine, unspecified, not intractable, without status migrainosus: ICD-10-CM

## 2018-12-25 ENCOUNTER — Encounter: Admit: 2018-12-25 | Discharge: 2018-12-25

## 2020-08-11 IMAGING — CT CT ANGIO CHEST W
1 of 4 series · 18 of 36 positions shown · IV contrast (77 ML OMNI 350)
Comparison: CT chest July 31, 2017.

CT ANGIO CHEST W  

PROCEDURE: CT angiography of the chest with contrast.
TECHNIQUE: Multiple contiguous axial images were obtained through  
 the chest after uneventful bolus administration of intravenous  
 contrast. 3D reconstructed CTA MIP acquisitions were also  
 performed. Auto Exposure Controls were utilized during the CT  
 exam to meet [employer] standards for radiation dose reduction.   
 DATE: August 19, 2019.
INDICATION: 43-year-old female, shortness of breath. History of  
 pulmonary embolus.

[Series 2: pe chest · axial · 0.74mm/px · z∈[-292,+12]mm · 18 of 166 slices shown]
[im 7/166  lung]
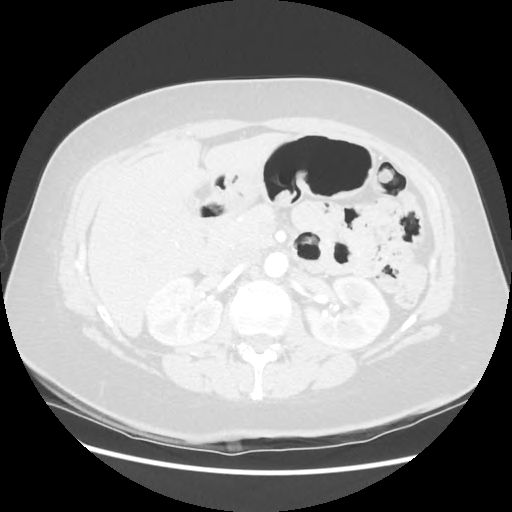
[im 20/166  mediastinal]
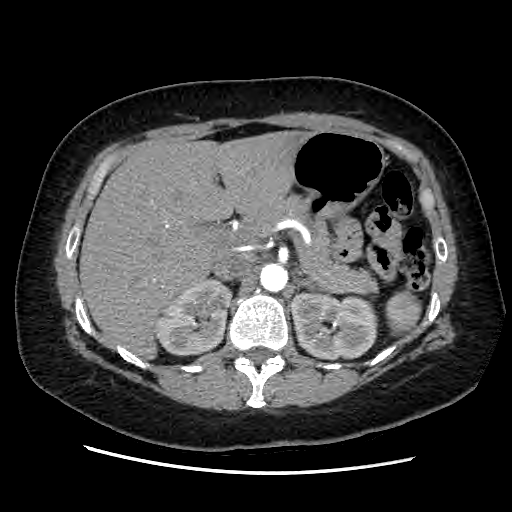
[im 26/166  lung]
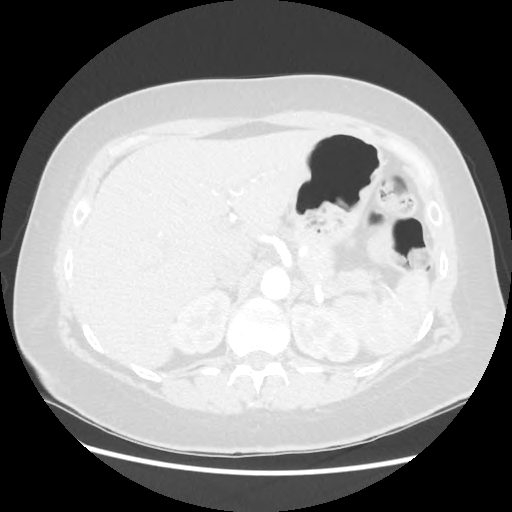
[im 32/166  mediastinal]
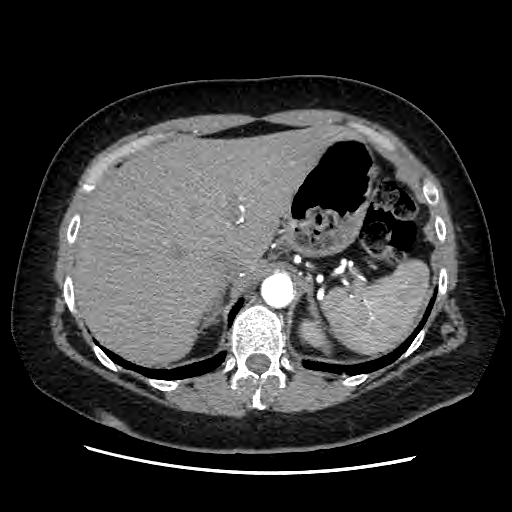
[im 45/166  lung]
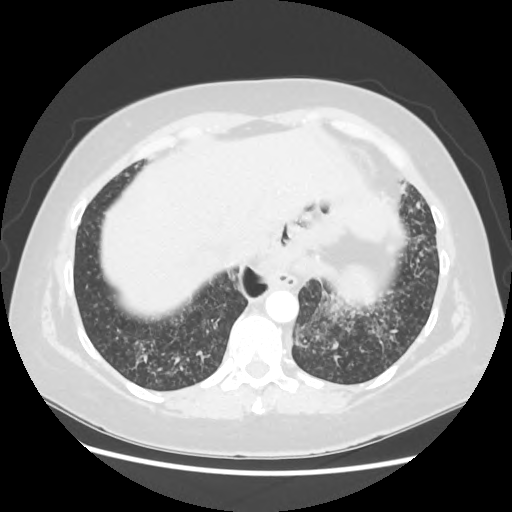
[im 51/166  mediastinal]
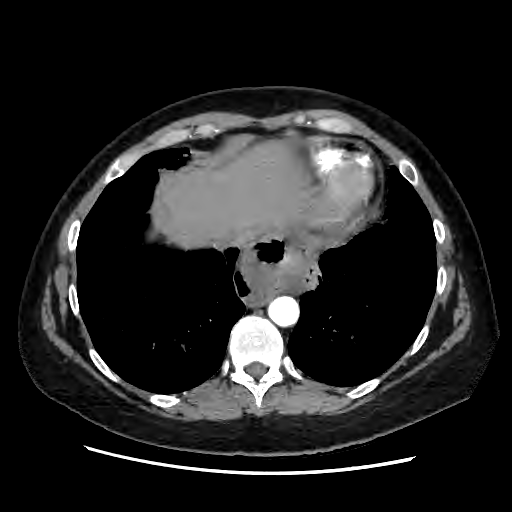
[im 64/166  lung]
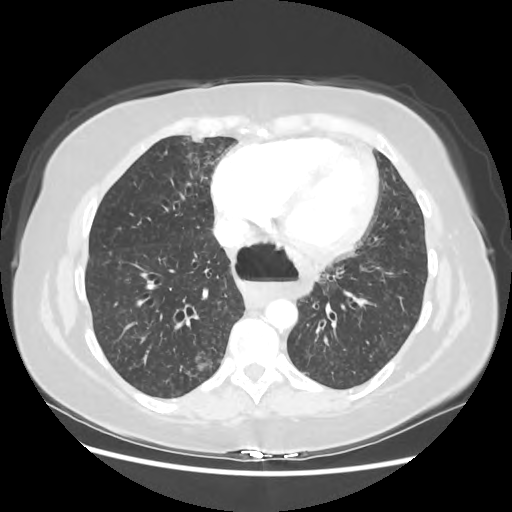
[im 70/166  mediastinal]
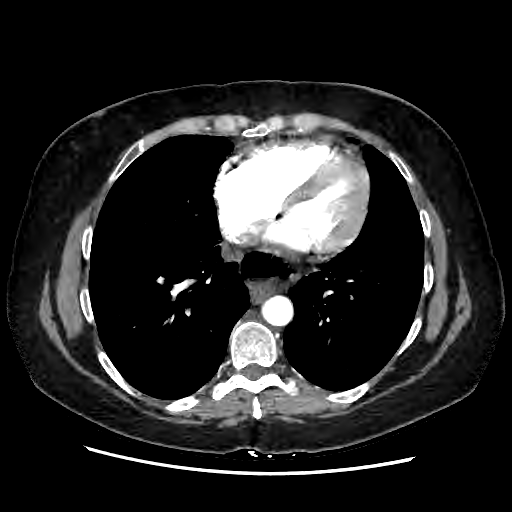
[im 77/166  lung]
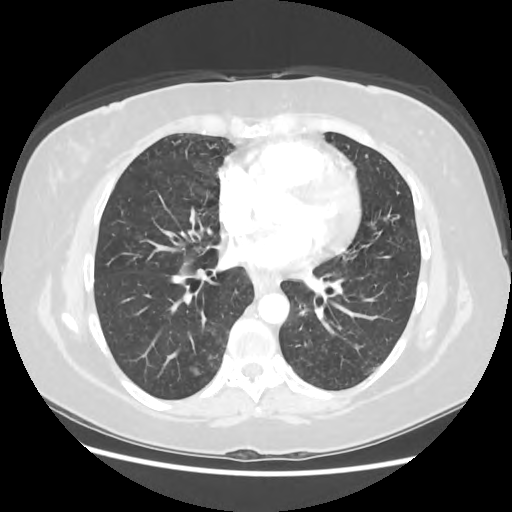
[im 89/166  mediastinal]
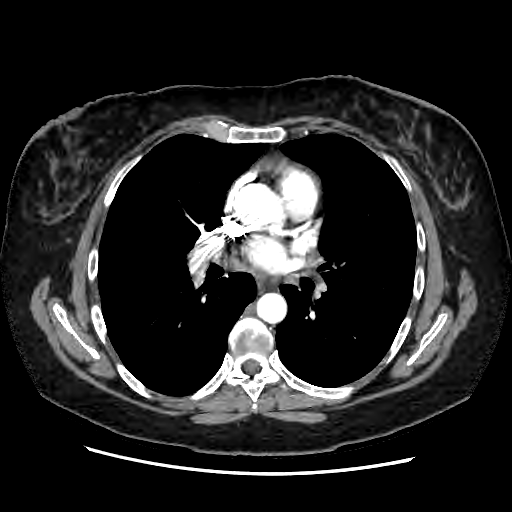
[im 96/166  lung]
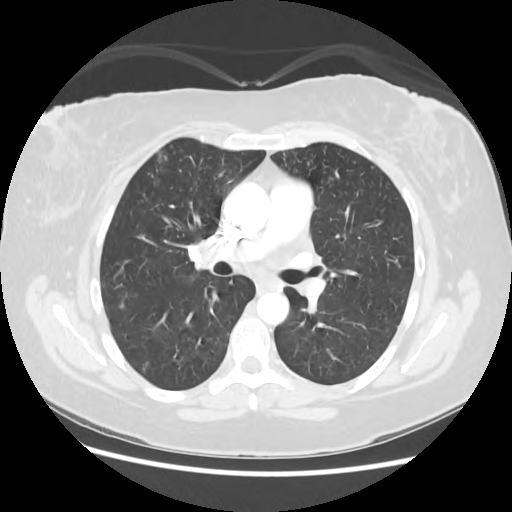
[im 102/166  mediastinal]
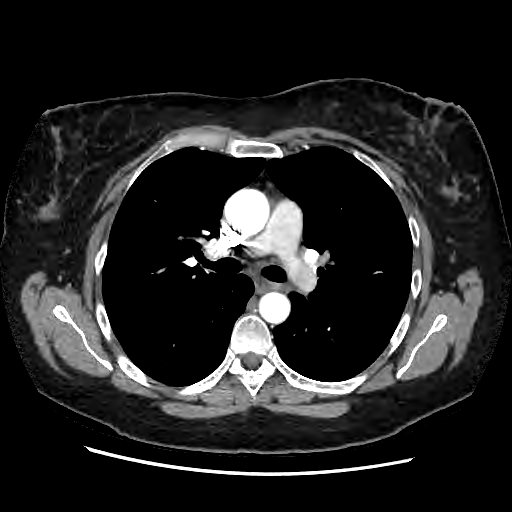
[im 115/166  lung]
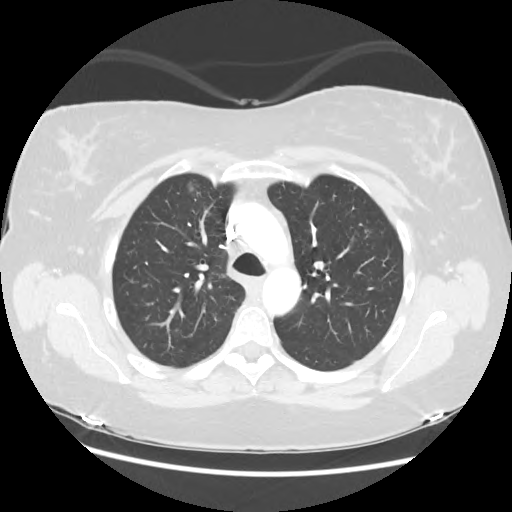
[im 121/166  mediastinal]
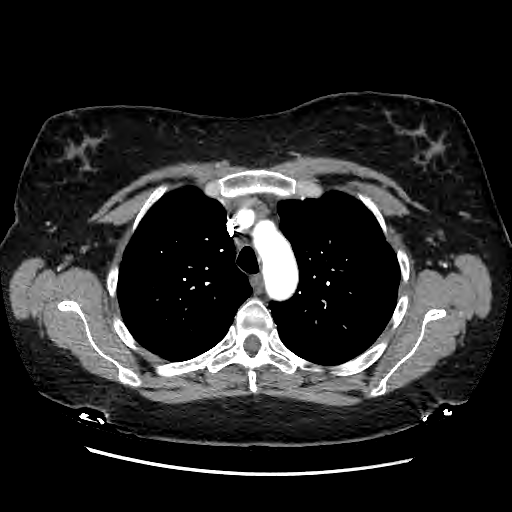
[im 134/166  lung]
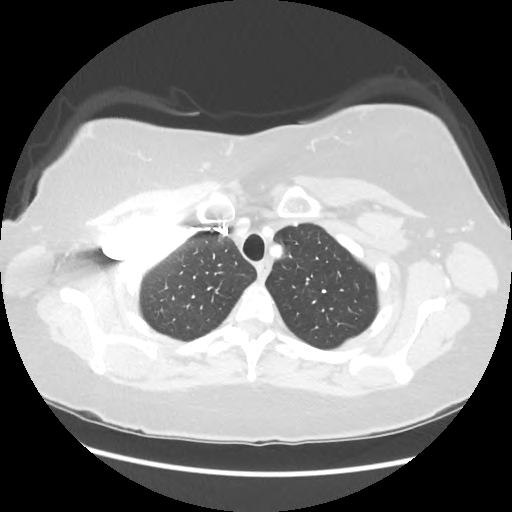
[im 140/166  mediastinal]
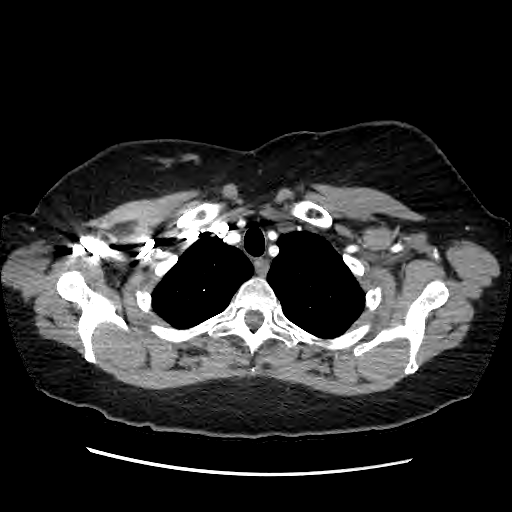
[im 146/166  lung]
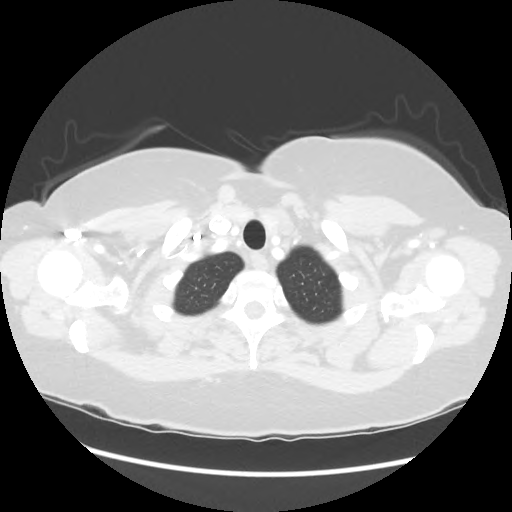
[im 159/166  mediastinal]
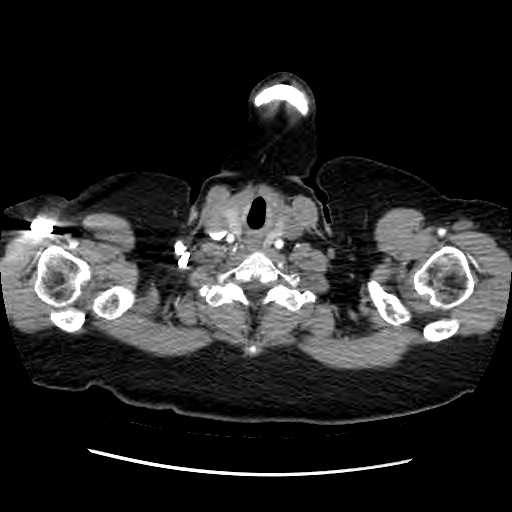

[18 of 36 positions shown; findings below may reference images not displayed]

FINDINGS: There is extensive tree-in-bud nodularity in the right  

 middle lobe, right lower lobe, left lower lobe and additional  

 tree-in-bud nodularity also present in the right upper lobe and  

 left upper lobe. There is mild right middle lobe bronchiectasis.  

 There is also mild right lower lobe and left lower lobe  

 bronchiectasis and lingular bronchiectasis. There are areas of  

 opacification within the bronchi. There is no identified lung  

 mass. There is no pneumothorax. No pleural effusion. The more  

 central airways are patent.  

 There is very limited evaluation for pulmonary embolus given  

 timing of the contrast bolus. There is no identified large  

 central pulmonary embolus. There is essentially nondiagnostic  

 evaluation for segmental and subsegmental pulmonary emboli. The  

 main pulmonary artery diameter measures within normal limits at  

 2.5 cm.  

 The heart is not enlarged. There is no pericardial effusion. The  

 thoracic aorta is normal in caliber.  

 There is no identified abnormally enlarged mediastinal, hilar or  

 axillary lymph node meeting CT size criteria for adenopathy.  

 There is a low-attenuation left thyroid nodule measuring 12 mm in  

 size on axial image 14. There is a moderate-sized hiatal hernia.  

 Evaluation for wall thickening at the level of the hernia is a  

 limited. Additional evaluation of the imaged portions of the  

 upper abdomen is unremarkable.  

 There is no identified acute bony abnormality.
IMPRESSION: CT chest:   

 1. Multifocal tree-in-bud nodularity in the lungs with mid and  

 lower lung zone predominance, which is new since August 30, 2017.  

 This likely relates to a process spreading via endobronchial  

 means which is most typically an infectious bronchiolitis or  

 aspiration. Atypical infectious etiologies such as mycobacterium  

 avium intracellulare are in the differential diagnosis and  

 perhaps favored.  

 2. Mild right middle lobe, lingular, right lower lobe and left  

 lower lobe bronchiectasis with nonspecific opacification within  

 some of the dilated bronchi.  

 3. Moderate-sized hiatal hernia. Limited evaluation for wall  

 thickening at the level of the hernia.  

 4. 12 mm left lower nodule.  

 5. Very limited evaluation for pulmonary embolus with essentially  

 nondiagnostic evaluation for segmental and subsegmental pulmonary  

 emboli. The main pulmonary artery diameter is within normal  

 limits.

## 2020-09-29 ENCOUNTER — Encounter: Admit: 2020-09-29 | Discharge: 2020-09-29 | Payer: 59

## 2020-09-29 DIAGNOSIS — I2721 Secondary pulmonary arterial hypertension: Secondary | ICD-10-CM

## 2020-10-17 ENCOUNTER — Encounter: Admit: 2020-10-17 | Discharge: 2020-10-17 | Payer: 59
